# Patient Record
Sex: Female | Born: 1985 | Race: White | Hispanic: No | Marital: Married | Smoking: Never smoker
Health system: Southern US, Community
[De-identification: ages and names within clinical notes are randomized; demographics above are authoritative.]

## PROBLEM LIST (undated history)

## (undated) DIAGNOSIS — E119 Type 2 diabetes mellitus without complications: Secondary | ICD-10-CM

---

## 2022-04-07 ENCOUNTER — Emergency Department: Payer: Self-pay

## 2022-04-07 ENCOUNTER — Encounter: Payer: Self-pay | Admitting: Emergency Medicine

## 2022-04-07 ENCOUNTER — Other Ambulatory Visit: Payer: Self-pay

## 2022-04-07 ENCOUNTER — Emergency Department
Admission: EM | Admit: 2022-04-07 | Discharge: 2022-04-07 | Disposition: A | Discharge status: Home / Self Care | Payer: Self-pay | Attending: Emergency Medicine | Admitting: Emergency Medicine

## 2022-04-07 DIAGNOSIS — E1165 Type 2 diabetes mellitus with hyperglycemia: Secondary | ICD-10-CM | POA: Insufficient documentation

## 2022-04-07 DIAGNOSIS — R748 Abnormal levels of other serum enzymes: Secondary | ICD-10-CM | POA: Insufficient documentation

## 2022-04-07 DIAGNOSIS — R7401 Elevation of levels of liver transaminase levels: Secondary | ICD-10-CM | POA: Insufficient documentation

## 2022-04-07 DIAGNOSIS — K8020 Calculus of gallbladder without cholecystitis without obstruction: Secondary | ICD-10-CM

## 2022-04-07 HISTORY — DX: Type 2 diabetes mellitus without complications: E11.9

## 2022-04-07 LAB — COMPREHENSIVE METABOLIC PANEL
ALT: 41 U/L (ref 0–44)
AST: 52 U/L — ABNORMAL HIGH (ref 15–41)
Albumin: 3.8 g/dL (ref 3.5–5.0)
Alkaline Phosphatase: 74 U/L (ref 38–126)
Anion gap: 7 (ref 5–15)
BUN: 24 mg/dL — ABNORMAL HIGH (ref 6–20)
CO2: 22 mmol/L (ref 22–32)
Calcium: 8.7 mg/dL — ABNORMAL LOW (ref 8.9–10.3)
Chloride: 105 mmol/L (ref 98–111)
Creatinine, Ser: 0.52 mg/dL (ref 0.44–1.00)
GFR, Estimated: >60 mL/min (ref >60)
Glucose, Bld: 240 mg/dL — ABNORMAL HIGH (ref 70–99)
Potassium: 3.6 mmol/L (ref 3.5–5.1)
Sodium: 134 mmol/L — ABNORMAL LOW (ref 135–145)
Total Bilirubin: 0.5 mg/dL (ref 0.3–1.2)
Total Protein: 7.2 g/dL (ref 6.5–8.1)

## 2022-04-07 LAB — CBC WITH DIFFERENTIAL/PLATELET
Abs Immature Granulocytes: 0.01 10*3/uL (ref 0.00–0.07)
Basophils Absolute: 0 10*3/uL (ref 0.0–0.1)
Basophils Relative: 0 %
Eosinophils Absolute: 0.1 10*3/uL (ref 0.0–0.5)
Eosinophils Relative: 2 %
HCT: 35.3 % — ABNORMAL LOW (ref 36.0–46.0)
Hemoglobin: 12.2 g/dL (ref 12.0–15.0)
Immature Granulocytes: 0 %
Lymphocytes Relative: 36 %
Lymphs Abs: 2.2 10*3/uL (ref 0.7–4.0)
MCH: 29.8 pg (ref 26.0–34.0)
MCHC: 34.6 g/dL (ref 30.0–36.0)
MCV: 86.3 fL (ref 80.0–100.0)
Monocytes Absolute: 0.4 10*3/uL (ref 0.1–1.0)
Monocytes Relative: 6 %
Neutro Abs: 3.4 10*3/uL (ref 1.7–7.7)
Neutrophils Relative %: 56 %
Platelets: 187 10*3/uL (ref 150–400)
RBC: 4.09 MIL/uL (ref 3.87–5.11)
RDW: 11.7 % (ref 11.5–15.5)
WBC: 6.1 10*3/uL (ref 4.0–10.5)
nRBC: 0 % (ref 0.0–0.2)

## 2022-04-07 LAB — LIPASE, BLOOD: Lipase: 66 U/L — ABNORMAL HIGH (ref 11–51)

## 2022-04-07 MED ORDER — ONDANSETRON HCL 4 MG/2ML IJ SOLN
4.0000 mg | Freq: Once | INTRAMUSCULAR | Status: AC
  Administered 2022-04-07: 4 mg via INTRAVENOUS
  Filled 2022-04-07: qty 2

## 2022-04-07 MED ORDER — FENTANYL CITRATE PF 50 MCG/ML IJ SOSY
50.0000 ug | PREFILLED_SYRINGE | Freq: Once | INTRAMUSCULAR | Status: AC
  Administered 2022-04-07: 50 ug via INTRAVENOUS
  Filled 2022-04-07: qty 1

## 2022-04-07 MED ORDER — FAMOTIDINE IN NACL 20-0.9 MG/50ML-% IV SOLN
20.0000 mg | Freq: Once | INTRAVENOUS | Status: AC
  Administered 2022-04-07: 20 mg via INTRAVENOUS
  Filled 2022-04-07: qty 50

## 2022-04-07 NOTE — ED Triage Notes (Signed)
Pt to triage via w/c with no distress noted brought in by EMS; reports awoke with onset mid abd pain, nonradiating with no accomp symptoms

## 2022-04-07 NOTE — ED Provider Notes (Signed)
[facility] Provider Note    Event Date/Time   First MD Initiated Contact with Patient 04/07/22 [phone]     (approximate)   History   Abdominal Pain   HPI  Olivia Hart is a 36 y.o. female with a history of diabetes who presents for evaluation of epigastric abdominal pain.  Patient reports being in her usual state of health when she went to bed last night.  Had a salad for dinner before going to bed.  She woke up in the middle of the night with epigastric burning pain.  She describes the pain as 8 out of 10.  No nausea or vomiting, no fever or chills, no diarrhea or constipation, no dysuria or hematuria.  The pain does not radiate.  Patient reports having this pain once in the past but it was not as severe and did not last this long.  She has had a C-section in the past but no other abdominal surgeries.     Past Medical History:  Diagnosis Date   Diabetes mellitus without complication ([place])     History reviewed. No pertinent surgical history.   Physical Exam   Triage Vital Signs: ED Triage Vitals  Enc Vitals Group     BP 04/07/22 0212 109/75     Pulse Rate 04/07/22 0212 60     Resp 04/07/22 0212 18     Temp 04/07/22 0212 97.9 F (36.6 C)     Temp Source 04/07/22 0212 Oral     SpO2 04/07/22 0212 97 %     Weight 04/07/22 0207 190 lb (86.2 kg)     Height 04/07/22 0207 5\' 4"  (1.626 m)     Head Circumference --      Peak Flow --      Pain Score 04/07/22 0206 5     Pain Loc --      Pain Edu? --      Excl. in [place]? --     Most recent vital signs: Vitals:   04/07/22 0212 04/07/22 0416  BP: 109/75 (!) 141/91  Pulse: 60 67  Resp: 18 17  Temp: 97.9 F (36.6 C)   SpO2: 97% 97%     Constitutional: Alert and oriented. Well appearing and in no apparent distress. HEENT:      Head: Normocephalic and atraumatic.         Eyes: Conjunctivae are normal. Sclera is non-icteric.       Mouth/Throat: Mucous membranes are moist.       Neck: Supple with  no signs of meningismus. Cardiovascular: Regular rate and rhythm. No murmurs, gallops, or rubs. 2+ symmetrical distal pulses are present in all extremities.  Respiratory: Normal respiratory effort. Lungs are clear to auscultation bilaterally.  Gastrointestinal: Soft, non tender, and non distended with positive bowel sounds. No rebound or guarding. Genitourinary: No CVA tenderness. Musculoskeletal:  No edema, cyanosis, or erythema of extremities. Neurologic: Normal speech and language. Face is symmetric. Moving all extremities. No gross focal neurologic deficits are appreciated. Skin: Skin is warm, dry and intact. No rash noted. Psychiatric: Mood and affect are normal. Speech and behavior are normal.  ED Results / Procedures / Treatments   Labs (all labs ordered are listed, but only abnormal results are displayed) Labs Reviewed  CBC WITH DIFFERENTIAL/PLATELET - Abnormal; Notable for the following components:      Result Value   HCT 35.3 (*)    All other components within normal limits  COMPREHENSIVE METABOLIC PANEL - Abnormal; Notable  for the following components:   Sodium 134 (*)    Glucose, Bld 240 (*)    BUN 24 (*)    Calcium 8.7 (*)    AST 52 (*)    All other components within normal limits  LIPASE, BLOOD - Abnormal; Notable for the following components:   Lipase 66 (*)    All other components within normal limits  URINALYSIS, ROUTINE W REFLEX MICROSCOPIC  POC URINE PREG, ED     EKG  ED ECG REPORT I, Rudene Re, the attending physician, personally viewed and interpreted this ECG.  Sinus rhythm with a rate of 66, right axis deviation, normal intervals, no ST elevations or depressions  RADIOLOGY I, Rudene Re, attending MD, have personally viewed and interpreted the images obtained during this visit as below:  Ultrasound showing cholelithiasis with no evidence of cholecystitis   ___________________________________________________ Interpretation by  Radiologist:  US ABDOMEN LIMITED RUQ (LIVER/GB)  Result Date: 04/07/2022 CLINICAL DATA:  36 year old female with history of epigastric pain. EXAM: ULTRASOUND ABDOMEN LIMITED RIGHT UPPER QUADRANT COMPARISON:  No priors. FINDINGS: Gallbladder: In the dependent portion of the gallbladder there are multiple echogenic foci, some of which demonstrates some posterior acoustic shadowing, indicative of gallstones and biliary sludge balls. Gallbladder is moderately distended. Gallbladder wall thickness is normal at 3 mm. No pericholecystic fluid. Per report from the sonographer, there was no sonographic Murphy's sign on examination. Common bile duct: Diameter: 5.1 mm Liver: No focal lesion identified. Within normal limits in parenchymal echogenicity. Portal vein is patent on color Doppler imaging with normal direction of blood flow towards the liver. Other: None. IMPRESSION: 1. Study is positive for cholelithiasis, without definitive evidence to indicate an acute cholecystitis at this time. Electronically Signed   By: Vinnie Langton M.D.   On: 04/07/2022 05:37      PROCEDURES:  Critical Care performed: No  Procedures    IMPRESSION / MDM / ASSESSMENT AND PLAN / ED COURSE  I reviewed the triage vital signs and the nursing notes.  36 y.o. female with a history of diabetes who presents for evaluation of burning epigastric abdominal pain.  Patient is well-appearing in no distress with normal vital signs.  Abdomen is soft with no significant tenderness on palpation.  Ddx: Indigestion/GERD versus pancreatitis versus gallbladder pathology versus peptic ulcer disease versus gastritis   Plan: CBC, CMP, lipase, pregnancy test, urinalysis, right upper quadrant ultrasound.  We will treat the pain with IV fentanyl, Zofran and famotidine.   MEDICATIONS GIVEN IN ED: Medications  fentaNYL (SUBLIMAZE) injection 50 mcg (50 mcg Intravenous Given 04/07/22 0415)  ondansetron (ZOFRAN) injection 4 mg (4 mg Intravenous  Given 04/07/22 0415)  famotidine (PEPCID) IVPB 20 mg premix (0 mg Intravenous Stopped 04/07/22 0503)     ED COURSE: No leukocytosis, mildly elevated AST of 52 and mildly elevated lipase of 66.  Glucose is 240 with a history of diabetes, no signs of DKA.  Right upper quadrant ultrasound showing cholelithiasis with no evidence of cholecystitis.  Patient was reassessed and is pain-free, remains with no tenderness on palpation.  We discussed dietary changes and referral to surgery.  With no pain and a negative ultrasound I feel patient is stable for discharge home.  She passed p.o. challenge without any further episodes of pain.  Discussed my standard return precautions.   Consults: None   EMR reviewed none available    FINAL CLINICAL IMPRESSION(S) / ED DIAGNOSES   Final diagnoses:  Calculus of gallbladder without cholecystitis without obstruction  Rx / DC Orders   ED Discharge Orders          Ordered    Ambulatory referral to General Surgery       Comments: Symptomatic cholelithiasis   04/07/22 0607             Note:  This document was prepared using Dragon voice recognition software and may include unintentional dictation errors.   Please note:  Patient was evaluated in Emergency Department today for the symptoms described in the history of present illness. Patient was evaluated in the context of the global COVID-19 pandemic, which necessitated consideration that the patient might be at risk for infection with the SARS-CoV-2 virus that causes COVID-19. Institutional protocols and algorithms that pertain to the evaluation of patients at risk for COVID-19 are in a state of rapid change based on information released by regulatory bodies including the CDC and federal and state organizations. These policies and algorithms were followed during the patient's care in the ED.  Some ED evaluations and interventions may be delayed as a result of limited staffing during the  pandemic.       Alfred Levins, [place], MD 04/07/22 [phone]

## 2022-05-06 ENCOUNTER — Ambulatory Visit: Payer: Self-pay | Admitting: Surgery

## 2022-12-19 ENCOUNTER — Ambulatory Visit: Payer: Self-pay | Admitting: Surgery

## 2023-01-16 ENCOUNTER — Ambulatory Visit: Payer: Self-pay | Admitting: Surgery

## 2023-01-30 ENCOUNTER — Ambulatory Visit: Payer: Self-pay | Admitting: Surgery

## 2023-07-07 IMAGING — US US ABDOMEN LIMITED
1 series · 14 of 25 positions shown · non-contrast
Comparison: No priors.

CLINICAL DATA: 36-year-old female with history of epigastric pain.

EXAM:
ULTRASOUND ABDOMEN LIMITED RIGHT UPPER QUADRANT

[Series 1: us abdomen limited ruq (liver/gb) · 14 of 39 slices shown]
[im 1/39]
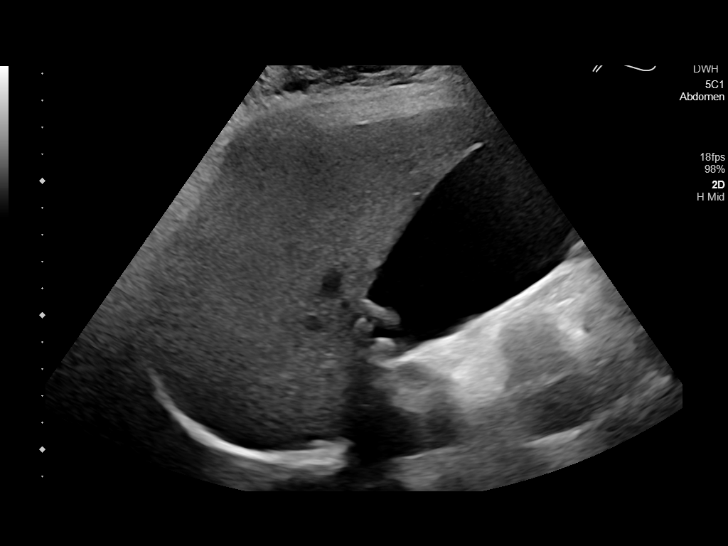
[im 4/39]
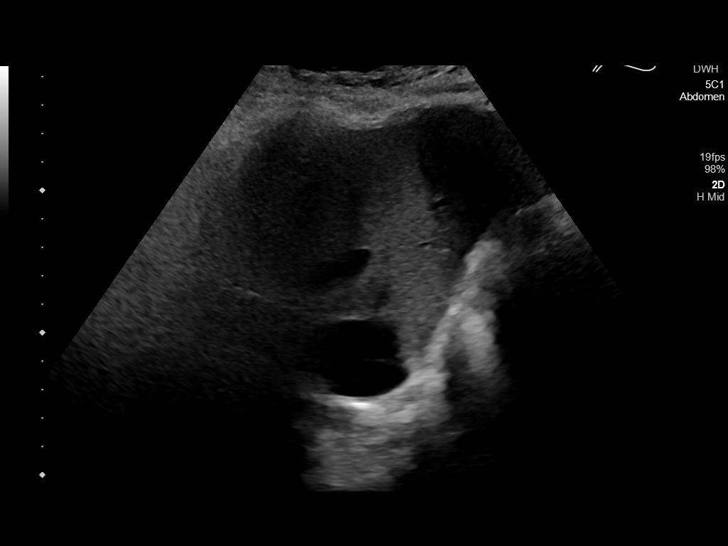
[im 7/39]
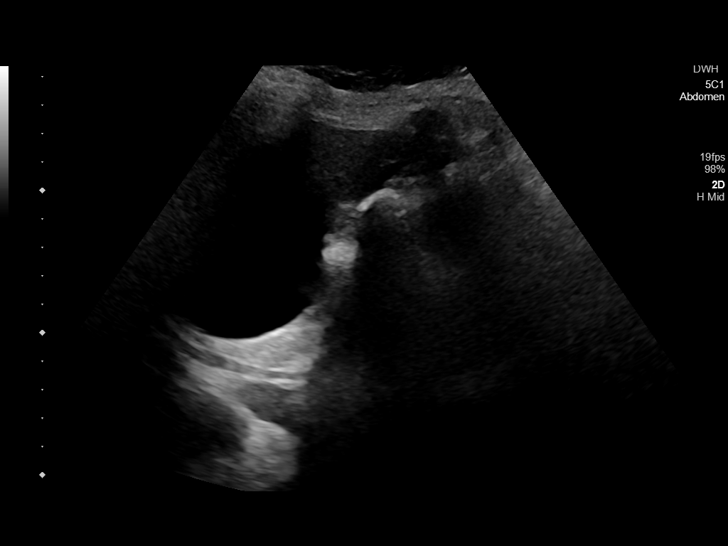
[im 10/39]
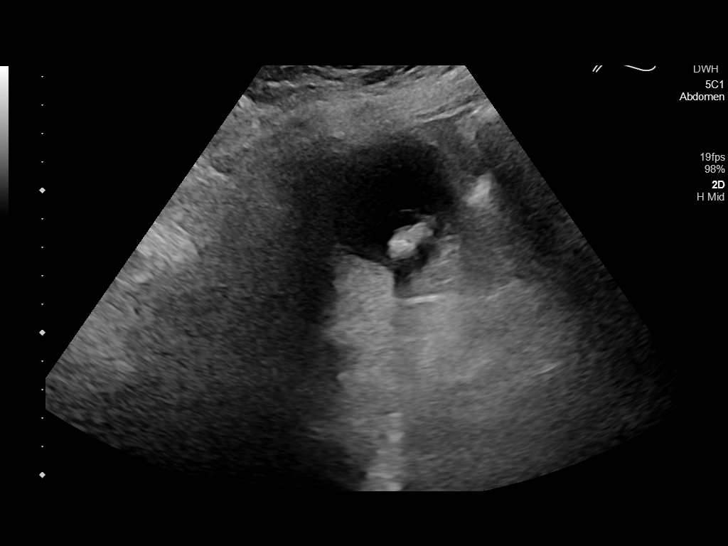
[im 13/39]
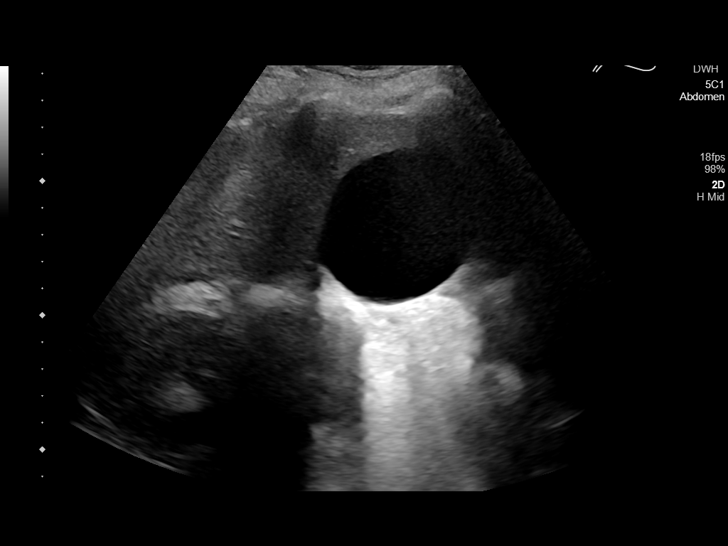
[im 15/39]
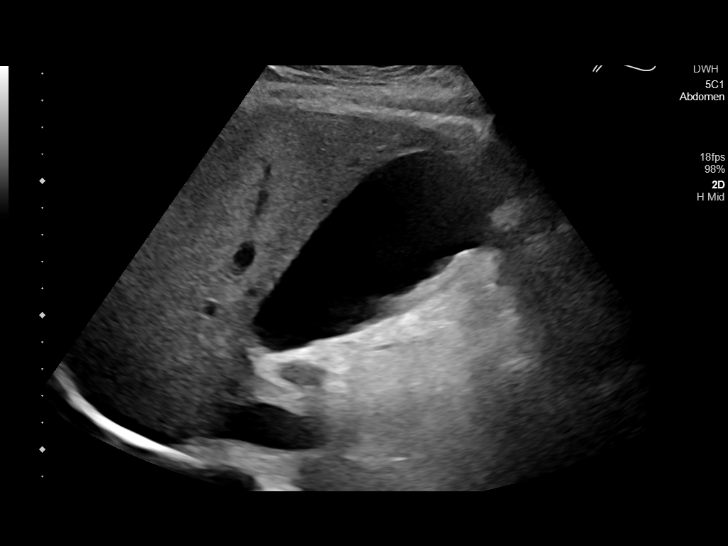
[im 18/39]
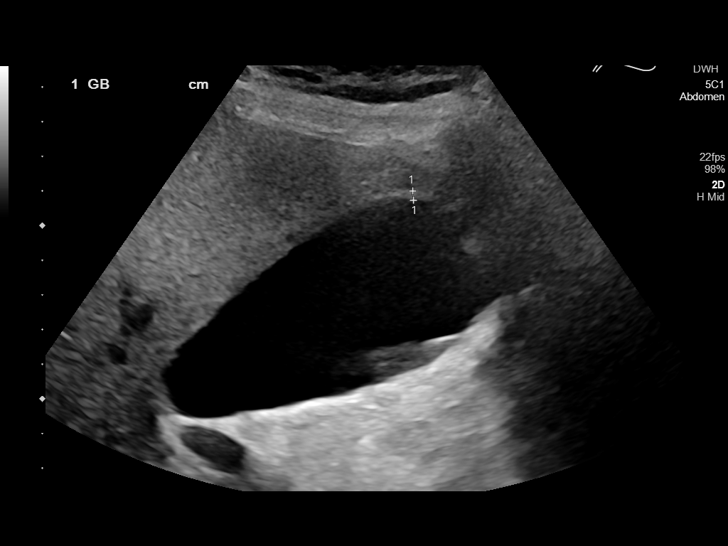
[im 21/39]
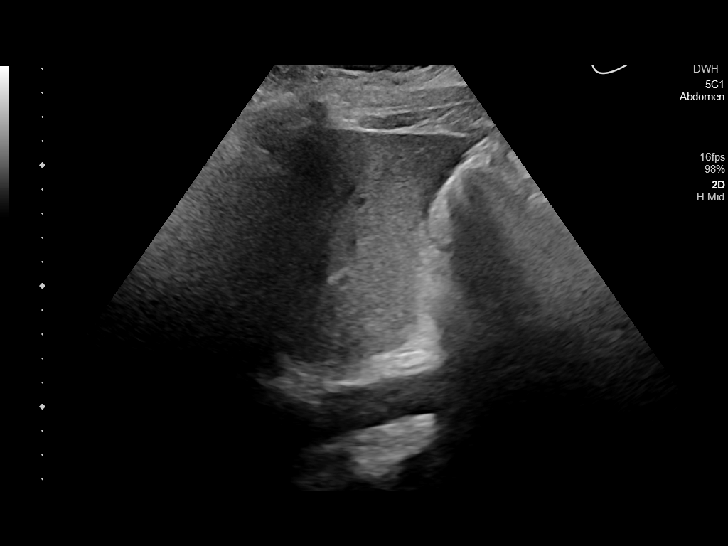
[im 24/39]
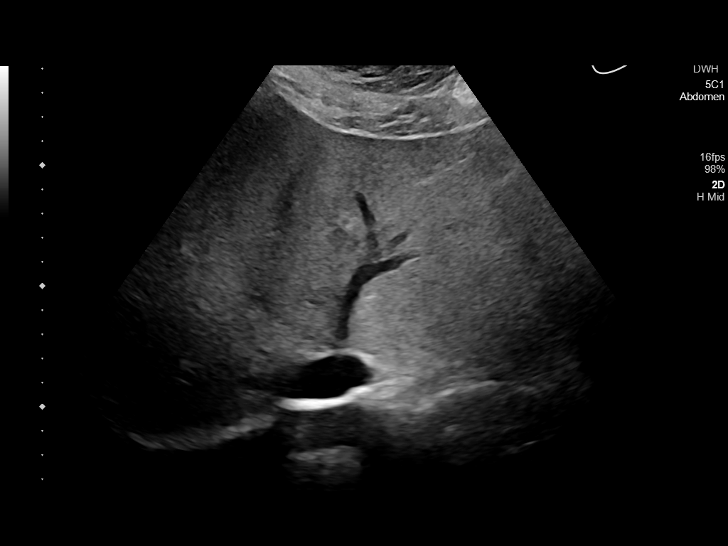
[im 26/39]
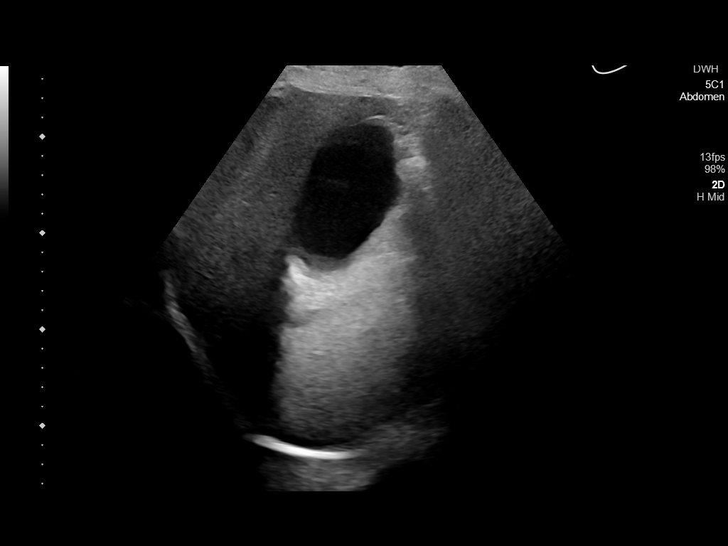
[im 29/39]
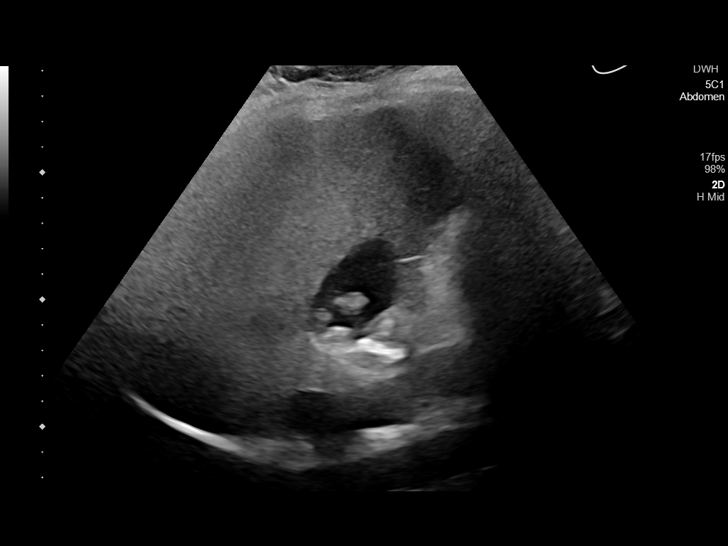
[im 32/39]
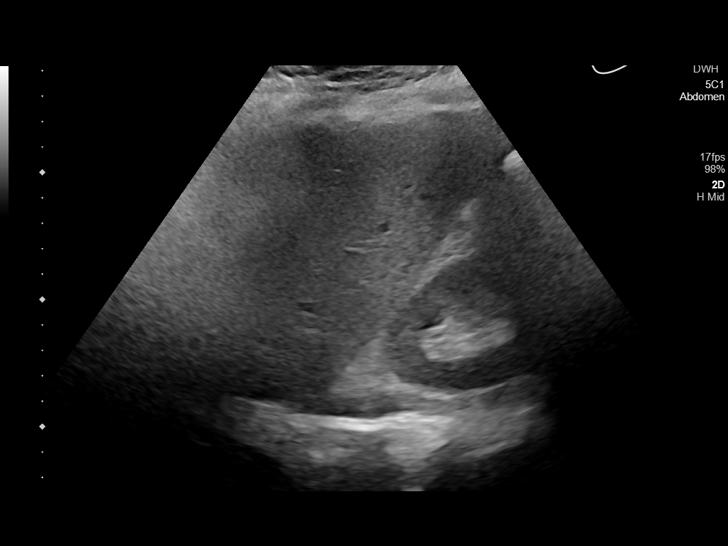
[im 35/39]
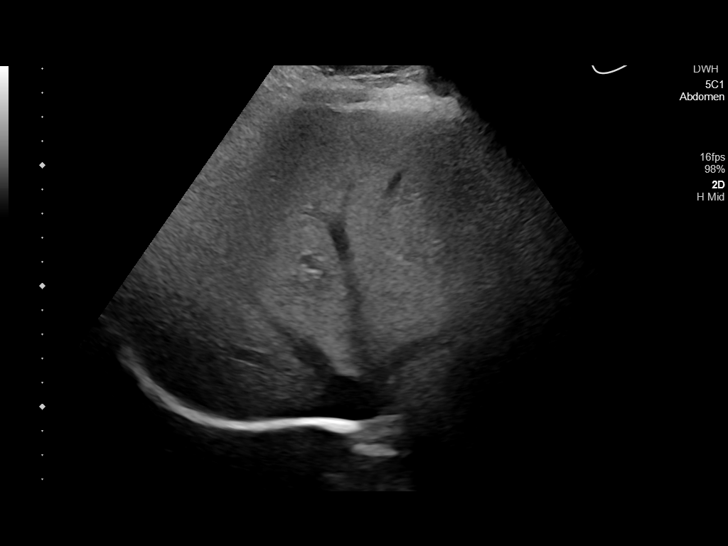
[im 39/39]
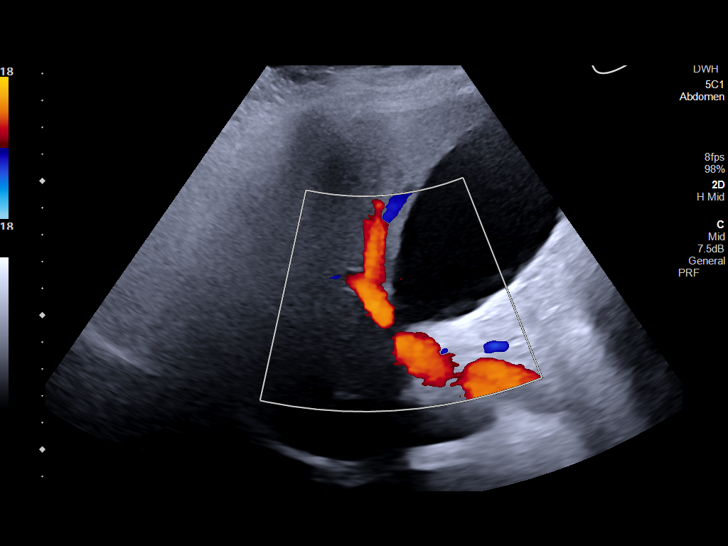

[14 of 25 positions shown; findings below may reference images not displayed]

FINDINGS: Gallbladder:

In the dependent portion of the gallbladder there are multiple
echogenic foci, some of which demonstrates some posterior acoustic
shadowing, indicative of gallstones and biliary sludge balls.
Gallbladder is moderately distended. Gallbladder wall thickness is
normal at 3 mm. No pericholecystic fluid. Per report from the
sonographer, there was no sonographic Murphy's sign on examination.

Common bile duct:

Diameter: 5.1 mm

Liver:

No focal lesion identified. Within normal limits in parenchymal
echogenicity. Portal vein is patent on color Doppler imaging with
normal direction of blood flow towards the liver.

Other: None.
IMPRESSION: 1. Study is positive for cholelithiasis, without definitive evidence
to indicate an acute cholecystitis at this time.
